# Patient Record
Sex: Female | Born: 1960 | Race: White | Hispanic: No | Marital: Married | Smoking: Never smoker
Health system: Southern US, Community
[De-identification: ages and names within clinical notes are randomized; demographics above are authoritative.]

## PROBLEM LIST (undated history)

## (undated) DIAGNOSIS — R634 Abnormal weight loss: Secondary | ICD-10-CM

## (undated) DIAGNOSIS — I10 Essential (primary) hypertension: Secondary | ICD-10-CM

## (undated) DIAGNOSIS — K219 Gastro-esophageal reflux disease without esophagitis: Secondary | ICD-10-CM

## (undated) HISTORY — DX: Essential (primary) hypertension: I10

## (undated) HISTORY — PX: ANKLE SURGERY: SHX546

## (undated) HISTORY — DX: Abnormal weight loss: R63.4

## (undated) HISTORY — DX: Gastro-esophageal reflux disease without esophagitis: K21.9

---

## 2011-08-10 ENCOUNTER — Other Ambulatory Visit: Payer: Self-pay | Admitting: Family Medicine

## 2011-08-10 DIAGNOSIS — Z1231 Encounter for screening mammogram for malignant neoplasm of breast: Secondary | ICD-10-CM

## 2011-08-28 ENCOUNTER — Ambulatory Visit
Admission: RE | Admit: 2011-08-28 | Discharge: 2011-08-28 | Disposition: A | Discharge status: Home / Self Care | Payer: 59 | Source: Ambulatory Visit | Attending: Family Medicine | Admitting: Family Medicine

## 2011-08-28 DIAGNOSIS — Z1231 Encounter for screening mammogram for malignant neoplasm of breast: Secondary | ICD-10-CM

## 2011-09-01 ENCOUNTER — Other Ambulatory Visit: Payer: Self-pay | Admitting: Family Medicine

## 2011-09-01 DIAGNOSIS — R928 Other abnormal and inconclusive findings on diagnostic imaging of breast: Secondary | ICD-10-CM

## 2011-09-18 ENCOUNTER — Ambulatory Visit
Admission: RE | Admit: 2011-09-18 | Discharge: 2011-09-18 | Disposition: A | Discharge status: Home / Self Care | Payer: 59 | Source: Ambulatory Visit | Attending: Family Medicine | Admitting: Family Medicine

## 2011-09-18 DIAGNOSIS — R928 Other abnormal and inconclusive findings on diagnostic imaging of breast: Secondary | ICD-10-CM

## 2012-08-31 ENCOUNTER — Other Ambulatory Visit: Payer: Self-pay | Admitting: Family Medicine

## 2012-08-31 DIAGNOSIS — N6489 Other specified disorders of breast: Secondary | ICD-10-CM

## 2012-09-09 ENCOUNTER — Ambulatory Visit
Admission: RE | Admit: 2012-09-09 | Discharge: 2012-09-09 | Disposition: A | Discharge status: Home / Self Care | Payer: 59 | Source: Ambulatory Visit | Attending: Family Medicine | Admitting: Family Medicine

## 2012-09-09 DIAGNOSIS — N6489 Other specified disorders of breast: Secondary | ICD-10-CM

## 2014-01-19 ENCOUNTER — Ambulatory Visit (INDEPENDENT_AMBULATORY_CARE_PROVIDER_SITE_OTHER): Payer: BC Managed Care – PPO | Admitting: Podiatrist

## 2014-01-19 ENCOUNTER — Ambulatory Visit (INDEPENDENT_AMBULATORY_CARE_PROVIDER_SITE_OTHER): Payer: BC Managed Care – PPO

## 2014-01-19 ENCOUNTER — Encounter: Payer: Self-pay | Admitting: Podiatrist

## 2014-01-19 VITALS — BP 143/79 | HR 86 | Resp 18

## 2014-01-19 DIAGNOSIS — R52 Pain, unspecified: Secondary | ICD-10-CM

## 2014-01-19 DIAGNOSIS — M7750 Other enthesopathy of unspecified foot: Secondary | ICD-10-CM

## 2014-01-19 DIAGNOSIS — M659 Synovitis and tenosynovitis, unspecified: Secondary | ICD-10-CM

## 2014-01-19 MED ORDER — DEXAMETHASONE SODIUM PHOSPHATE 120 MG/30ML IJ SOLN
4.0000 mg | Freq: Once | INTRAMUSCULAR | Status: AC
  Administered 2014-01-19: 4 mg via INTRAMUSCULAR

## 2014-01-19 MED ORDER — TRIAMCINOLONE ACETONIDE 10 MG/ML IJ SUSP
10.0000 mg | Freq: Once | INTRAMUSCULAR | Status: AC
  Administered 2014-01-19: 10 mg

## 2014-01-19 NOTE — Progress Notes (Signed)
   Subjective:    Patient ID: Imagene RichesSandra Kilfoyle, female    DOB: 1961-02-15, 53 y.o.   MRN: [id number]  HPI had [facility] on my right ankle 2011 [facility] and the doctor was Dr. Remer MachoEasley and I had fallen in 2009 and the surgery made it worse and burns and throbs and hurts when I walking and it also hurts in the arch of my right foot and has hurt since the surgery    Review of Systems  Constitutional: Positive for appetite change.  Skin: Positive for color change.  All other systems reviewed and are negative.       Objective:   Physical Exam GENERAL APPEARANCE: Alert, conversant. Appropriately groomed. No acute distress.  VASCULAR: Pedal pulses palpable at 2/4 DP and PT bilateral.  Capillary refill time is immediate to all digits,  Proximal to distal cooling it warm to warm.  Digital hair growth is present bilateral  NEUROLOGIC: sensation is intact epicritically and protectively to 5.07 monofilament at 5/5 sites bilateral.  Light touch is intact bilateral, vibratory sensation intact bilateral, achilles tendon reflex is intact bilateral.  MUSCULOSKELETAL: Pain at the insertion of the posterior tibial tendon into the navicular is present. Pain along the posterior tibial tendon at its course along the medial malleolus is also present. Discomfort along the deltoid ligament continues to be present status post deltoid ligament Reconstruction several years ago. Minimal pain at the insertion of the plantar fascia on the medial calcaneal tubercle is palpated.  DERMATOLOGIC: skin color, texture, and turger are within normal limits.  No preulcerative lesions are seen, no interdigital maceration noted.  No open lesions present.  Digital nails are asymptomatic.      Assessment & Plan:  Posterior tibial tendinitis right  Plan: Injected the area of maximal tenderness with dexamethasone and Marcaine mixture under sterile technique without complication. A plantar fascial strapping was applied. She  has good conforming  inserts and I recommended she continue wearing this daily. She's also been through multiple sessions of physical therapy, anti-inflammatory and steroid therapy, bracing, inserts all of which have failed to relieve her pain. Today is her first injection and we'll see how she does with this. It is not beneficial we'll consider epat therapy. She will call if she would like to consider epat

## 2014-01-19 NOTE — Patient Instructions (Signed)
Posterior Tibial Tendon Tendinitis  with Rehab  Tendonitis is a condition that is characterized by inflammation of a tendon or the lining (sheath) that surrounds it. The inflammation is usually caused by damage to the tendon, such as a tendon tear (strain). Sprains are classified into three categories. Grade 1 sprains cause pain, but the tendon is not lengthened. Grade 2 sprains include a lengthened ligament due to the ligament being stretched or partially ruptured. With grade 2 sprains there is still function, although the function may be diminished. Grade 3 sprains are characterized by a complete tear of the tendon or muscle, and function is usually impaired. Posterior tibialis tendonitis is tendonitis of the posterior tibial tendon, which attaches muscles of the lower leg to the foot. The posterior tibial tendon is located in the back of the ankle and helps the body straighten (plantarflex) and rotate inward (medially rotate) the ankle.  SYMPTOMS   · Pain, tenderness, swelling, warmth, and/or redness over the back of the inner ankle at the posterior tibial tendon or the inner part of the mid-foot.  · Pain that worsens with plantarflexion or medial rotation of the ankle.  · A crackling sound (crepitation) when the tendon is moved or touched.  CAUSES   Posterior tibial tendonitis occurs when damage to the posterior tibial tendon starts an inflammatory response. Common mechanisms of injury include:  · Degenerative (occurs with aging) processes that weaken the tendon and make it more susceptible to injury.  · Stress placed on the tendon from an increase in the intensity, frequency, or duration of training.  · Direct trauma to the ankle.  · Returning to activity before a previous ankle injury is allowed to heal.  RISK INCREASES WITH:  · Activities that involve repetitive and/or stressful plantarflexion (jumping, kicking, or running up/down hills).  · Poor strength and flexibility.  · Flat feet.  · Previous injury to  the foot, ankle, or leg.  PREVENTION   · Warm up and stretch properly before activity.  · Allow for adequate recovery between workouts.  · Maintain physical fitness:  · Strength, flexibility, and endurance.  · Cardiovascular fitness.  · Learn and use proper technique. When possible, have a coach correct improper technique.  · Complete rehabilitation from a previous foot, ankle, or leg injury.  · If you have flat feet, wear arch supports (orthotics).  PROGNOSIS   If treated properly, then the symptoms of tendonitis usually resolve within 6 weeks. This period may be shorter for injuries caused by direct trauma.  RELATED COMPLICATIONS   · Prolonged healing time, if improperly treated or re-injured.  · Recurrent symptoms that result in a chronic problem.  · Partial or complete tendon tear (rupture) requiring surgery.  TREATMENT   Treatment initially involves the use of ice and medication to help reduce pain and inflammation. The use of strengthening and stretching exercises may help reduce pain with activity. These exercises may be performed at home or with referral to a therapist. Often times, your caregiver will recommend immobilizing the ankle to allow the tendon to heal. If you have flat feet, the you may be advised to wear orthotic arch supports. If symptoms persist for greater than 6 months despite non-surgical (conservative) treatment, then surgery may be recommended.  MEDICATION   · If pain medication is necessary, then nonsteroidal anti-inflammatory medications, such as aspirin and ibuprofen, or other minor pain relievers, such as acetaminophen, are often recommended.  · Do not take pain medication for 7 days before surgery.  ·   Prescription pain relievers may be given if deemed necessary by your caregiver. Use only as directed and only as much as you need.  · Corticosteroid injections may be given by your caregiver. These injections should be reserved for the most serious cases, because they may only be given a  certain number of times.  HEAT AND COLD  · Cold treatment (icing) relieves pain and reduces inflammation. Cold treatment should be applied for 10 to 15 minutes every 2 to 3 hours for inflammation and pain and immediately after any activity that aggravates your symptoms. Use ice packs or massage the area with a piece of ice (ice massage).  · Heat treatment may be used prior to performing the stretching and strengthening activities prescribed by your caregiver, physical therapist, or athletic trainer. Use a heat pack or soak the injury in warm water.  SEEK MEDICAL CARE IF:  · Treatment seems to offer no benefit, or the condition worsens.  · Any medications produce adverse side effects.  EXERCISES  RANGE OF MOTION (ROM) AND STRETCHING EXERCISES - Posterior Tibial Tendon Tendinitis  These exercises may help you when beginning to rehabilitate your injury. Your symptoms may resolve with or without further involvement from your physician, physical therapist or athletic trainer. While completing these exercises, remember:   · Restoring tissue flexibility helps normal motion to return to the joints. This allows healthier, less painful movement and activity.  · An effective stretch should be held for at least 30 seconds.  · A stretch should never be painful. You should only feel a gentle lengthening or release in the stretched tissue.  RANGE OF MOTION - Ankle Plantar Flexion   · Sit with your right / left leg crossed over your opposite knee.  · Use your opposite hand to pull the top of your foot and toes toward you.  · You should feel a gentle stretch on the top of your foot/ankle. Hold this position for __________ seconds.  Repeat __________ times. Complete this exercise __________ times per day.   RANGE OF MOTION - Ankle Eversion   · Sit with your right / left ankle crossed over your opposite knee.  · Grip your foot with your opposite hand, placing your thumb on the top of your foot and your fingers across the bottom of  your foot.  · Gently push your foot downward with a slight rotation so your littlest toes rise slightly  · You should feel a gentle stretch on the inside of your ankle. Hold the stretch for __________ seconds.  Repeat __________ times. Complete this exercise __________ times per day.   RANGE OF MOTION - Ankle Inversion   · Sit with your right / left ankle crossed over your opposite knee.  · Grip your foot with your opposite hand, placing your thumb on the bottom of your foot and your fingers across the top of your foot.  · Gently pull your foot so the smallest toe comes toward you and your thumb pushes the inside of the ball of your foot away from you.  · You should feel a gentle stretch on the outside of your ankle. Hold the stretch for __________ seconds.  Repeat __________ times. Complete this exercise __________ times per day.   RANGE OF MOTION - Dorsi/Plantar Flexion  · While sitting with your right / left knee straight, draw the top of your foot upwards by flexing your ankle. Then reverse the motion, pointing your toes downward.  · Hold each position for __________   seconds.  · After completing your first set of exercises, repeat this exercise with your knee bent.  Repeat __________ times. Complete this exercise __________ times per day.   RANGE OF MOTION - Ankle Alphabet  · Imagine your right / left big toe is a pen.  · Keeping your hip and knee still, write out the entire alphabet with your "pen." Make the letters as large as you can without increasing any discomfort.  Repeat __________ times. Complete this exercise __________ times per day.   STRETCH - Gastrocsoleus   · Sit with your right / left leg extended. Holding onto both ends of a belt or towel, loop it around the ball of your foot.  · Keeping your right / left ankle and foot relaxed and your knee straight, pull your foot and ankle toward you using the belt/towel.  · You should feel a gentle stretch behind your calf or knee. Hold this position for  __________ seconds.  Repeat __________ times. Complete this exercise __________ times per day.   STRETCH  Gastroc, Standing   · Place hands on wall.  · Extend right / left leg, keeping the front knee somewhat bent.  · Slightly point your toes inward on your back foot.  · Keeping your right / left heel on the floor and your knee straight, shift your weight toward the wall, not allowing your back to arch.  · You should feel a gentle stretch in the right / left calf. Hold this position for __________ seconds.  Repeat __________ times. Complete this stretch __________ times per day.  STRETCH  Soleus, Standing   · Place hands on wall.  · Extend right / left leg, keeping the other knee somewhat bent.  · Slightly point your toes inward on your back foot.  · Keep your right / left heel on the floor, bend your back knee, and slightly shift your weight over the back leg so that you feel a gentle stretch deep in your back calf.  · Hold this position for __________ seconds.  Repeat __________ times. Complete this stretch __________ times per day.  STRENGTHENING EXERCISES - Posterior Tibial Tendon Tendinitis  These exercises may help you when beginning to rehabilitate your injury. They may resolve your symptoms with or without further involvement from your physician, physical therapist or athletic trainer. While completing these exercises, remember:   · Muscles can gain both the endurance and the strength needed for everyday activities through controlled exercises.  · Complete these exercises as instructed by your physician, physical therapist or athletic trainer. Progress the resistance and repetitions only as guided.  STRENGTH - Dorsiflexors  · Secure a rubber exercise band/tubing to a fixed object (ie. table, pole) and loop the other end around your right / left foot.  · Sit on the floor facing the fixed object. The band/tubing should be slightly tense when your foot is relaxed.  · Slowly draw your foot back toward you using  your ankle and toes.  · Hold this position for __________ seconds. Slowly release the tension in the band and return your foot to the starting position.  Repeat __________ times. Complete this exercise __________ times per day.   STRENGTH - Towel Curls  · Sit in a chair positioned on a non-carpeted surface.  · Place your foot on a towel, keeping your heel on the floor.  · Pull the towel toward your heel by only curling your toes. Keep your heel on the floor.  · If instructed   by your physician, physical therapist or athletic trainer, add ____________________ at the end of the towel.  Repeat __________ times. Complete this exercise __________ times per day.  STRENGTH - Ankle Eversion   · Secure one end of a rubber exercise band/tubing to a fixed object (table, pole). Loop the other end around your foot just before your toes.  · Place your fists between your knees. This will focus your strengthening at your ankle.  · Drawing the band/tubing across your opposite foot, slowly, pull your little toe out and up. Make sure the band/tubing is positioned to resist the entire motion.  · Hold this position for __________ seconds.  · Have your muscles resist the band/tubing as it slowly pulls your foot back to the starting position.  Repeat __________ times. Complete this exercise __________ times per day.   STRENGTH - Ankle Inversion   · Secure one end of a rubber exercise band/tubing to a fixed object (table, pole). Loop the other end around your foot just before your toes.  · Place your fists between your knees. This will focus your strengthening at your ankle.  · Slowly, pull your big toe up and in, making sure the band/tubing is positioned to resist the entire motion.  · Hold this position for __________ seconds.  · Have your muscles resist the band/tubing as it slowly pulls your foot back to the starting position.  Repeat __________ times. Complete this exercises __________ times per day.   Document Released: 10/19/2005  Document Revised: 01/11/2012 Document Reviewed: 01/31/2009  [employer]® Patient Information ©2014 [employer].

## 2014-02-28 ENCOUNTER — Ambulatory Visit: Payer: BC Managed Care – PPO | Admitting: Podiatrist

## 2014-03-07 ENCOUNTER — Ambulatory Visit: Payer: Self-pay | Admitting: Podiatrist

## 2014-03-07 ENCOUNTER — Encounter: Payer: Self-pay | Admitting: Podiatrist

## 2014-03-07 VITALS — BP 131/72 | HR 82 | Resp 18

## 2014-03-07 DIAGNOSIS — M7750 Other enthesopathy of unspecified foot: Secondary | ICD-10-CM

## 2014-03-07 DIAGNOSIS — M659 Synovitis and tenosynovitis, unspecified: Secondary | ICD-10-CM

## 2014-03-07 NOTE — Progress Notes (Signed)
It hurts every day and hurts on the inside of the right foot  Patient presents today for initial epat therapy of the right foot s/p deltoid ligament repair and [facility]-- she relates she still has pain and that the surgery, although successful left her with pain on her medial instep and arch.  We are going to perform epat therapy for her right foot.  Objective:  Neurovascular status intact-  Pain on palpation along the deltoid ligament and posterior tibial tendon insertion.  Minimal swelling is present right foot as well.    Assessment:  Tendonitis posterior tibial tendon and deltoid ligament s/p surgical repair right  Plan:  epat #1 carried out at today's visit at a max bar of 2.5, 3500 pulses at 11 hz. She tolerated this procedure well she will be seen again in 2 weeks for epat 2.

## 2014-03-14 ENCOUNTER — Ambulatory Visit: Payer: BC Managed Care – PPO | Admitting: Podiatrist

## 2014-03-16 ENCOUNTER — Ambulatory Visit: Payer: BC Managed Care – PPO | Admitting: Podiatrist

## 2014-03-21 ENCOUNTER — Ambulatory Visit: Payer: BC Managed Care – PPO | Admitting: Podiatrist

## 2014-03-21 ENCOUNTER — Ambulatory Visit (INDEPENDENT_AMBULATORY_CARE_PROVIDER_SITE_OTHER): Payer: BC Managed Care – PPO | Admitting: Podiatrist

## 2014-03-21 ENCOUNTER — Encounter: Payer: Self-pay | Admitting: Podiatrist

## 2014-03-21 VITALS — BP 140/82 | HR 90 | Resp 16

## 2014-03-21 DIAGNOSIS — M7750 Other enthesopathy of unspecified foot: Secondary | ICD-10-CM

## 2014-03-21 DIAGNOSIS — M659 Synovitis and tenosynovitis, unspecified: Secondary | ICD-10-CM

## 2014-03-21 NOTE — Progress Notes (Signed)
Patient presents today for 2nd epat therapy of the right foot s/p deltoid ligament repair and [facility] tib [facility]-- she relates she still has pain on the right foot despite a previous surgery-- relates pain is on her medial instep and arch.  We are continuing epat therapy for her right foot.  Objective:  Neurovascular status intact-  Pain on palpation along the deltoid ligament and posterior tibial tendon insertion.  Minimal swelling is present right foot as well.    Assessment:  Tendonitis posterior tibial tendon and deltoid ligament s/p surgical repair right  Plan:  epat #2 carried out at today's visit at a max bar of 3.6, 3500 pulses at 11 hz. She tolerated this procedure well she will be seen again in 3 weeks for epat 3.

## 2014-04-11 ENCOUNTER — Ambulatory Visit (INDEPENDENT_AMBULATORY_CARE_PROVIDER_SITE_OTHER): Payer: BC Managed Care – PPO | Admitting: Podiatrist

## 2014-04-11 ENCOUNTER — Ambulatory Visit: Payer: BC Managed Care – PPO | Admitting: Podiatrist

## 2014-04-11 ENCOUNTER — Encounter: Payer: Self-pay | Admitting: Podiatrist

## 2014-04-11 VITALS — BP 122/86 | HR 86 | Resp 12

## 2014-04-11 DIAGNOSIS — M7750 Other enthesopathy of unspecified foot: Secondary | ICD-10-CM

## 2014-04-11 DIAGNOSIS — M659 Synovitis and tenosynovitis, unspecified: Secondary | ICD-10-CM

## 2014-04-11 NOTE — Progress Notes (Signed)
Patient presents today for epat therapy of the right foot s/p deltoid ligament repair and post tib tendon repair at [facility]-- she relates she still has pain and has noticed minimal to no improvement with EPAT therapy. Today is her 3rd treatment.   Objective: Neurovascular status intact- Pain on palpation along the deltoid ligament and posterior tibial tendon insertion. Minimal swelling is present right foot as well.   Assessment: Tendonitis posterior tibial tendon and deltoid ligament s/p surgical repair right   Plan: epat #3 carried out at today's visit at a max bar of 4.0, 3500 pulses at 12 hz. She tolerated this procedure well she will be seen again in 4 weeks for reevaluation and possible final epat 4

## 2014-05-16 ENCOUNTER — Ambulatory Visit (INDEPENDENT_AMBULATORY_CARE_PROVIDER_SITE_OTHER): Payer: BC Managed Care – PPO | Admitting: Podiatrist

## 2014-05-16 ENCOUNTER — Encounter: Payer: Self-pay | Admitting: Podiatrist

## 2014-05-16 VITALS — BP 135/87 | HR 99 | Resp 18

## 2014-05-16 DIAGNOSIS — M659 Synovitis and tenosynovitis, unspecified: Secondary | ICD-10-CM

## 2014-05-16 DIAGNOSIS — M7750 Other enthesopathy of unspecified foot: Secondary | ICD-10-CM

## 2014-05-16 NOTE — Progress Notes (Signed)
I AM DOING OK ON MY RIGHT HEEL  Patient presents today for epat therapy of the right foot s/p deltoid ligament repair and [facility]-- she relates she still has pain and has noticed minimal improvement with EPAT therapy. Today is her 4th treatment.   Objective: Neurovascular status intact- Pain on palpation along the deltoid ligament and posterior tibial tendon insertion. Minimal swelling is present right foot as well.   Assessment: Tendonitis posterior tibial tendon and deltoid ligament s/p surgical repair right   Plan: epat #4 carried out at today's visit at a max bar of 4.2, 3500 pulses at 11 hz. She tolerated this procedure well she will wait 4 weeks and will call if she feels another treatment would be beneficial.  We have discussed the only other option would possibly be surgery but she is not interested in pursuing any other surgeries on this foot.  I also did suggest breakthrough physical therapy for some possible dry needling therapy.  She will consider this option and call if she would like a referral.

## 2014-05-16 NOTE — Patient Instructions (Signed)

## 2016-03-18 ENCOUNTER — Other Ambulatory Visit: Payer: Self-pay | Admitting: Nurse Practitioner

## 2016-03-18 DIAGNOSIS — N6489 Other specified disorders of breast: Secondary | ICD-10-CM

## 2017-03-24 ENCOUNTER — Other Ambulatory Visit: Payer: Self-pay | Admitting: Nurse Practitioner

## 2017-03-24 DIAGNOSIS — N6489 Other specified disorders of breast: Secondary | ICD-10-CM

## 2017-03-31 ENCOUNTER — Other Ambulatory Visit: Payer: Self-pay

## 2017-04-02 ENCOUNTER — Other Ambulatory Visit: Payer: Self-pay

## 2017-04-27 ENCOUNTER — Other Ambulatory Visit: Payer: Self-pay

## 2017-04-28 ENCOUNTER — Ambulatory Visit
Admission: RE | Admit: 2017-04-28 | Discharge: 2017-04-28 | Disposition: A | Discharge status: Home / Self Care | Payer: BLUE CROSS/BLUE SHIELD | Source: Ambulatory Visit | Attending: Nurse Practitioner | Admitting: Nurse Practitioner

## 2017-04-28 DIAGNOSIS — N6489 Other specified disorders of breast: Secondary | ICD-10-CM

## 2019-09-19 ENCOUNTER — Other Ambulatory Visit: Payer: Self-pay

## 2019-09-19 ENCOUNTER — Encounter: Payer: Self-pay | Admitting: Sports Medicine

## 2019-09-19 ENCOUNTER — Ambulatory Visit (INDEPENDENT_AMBULATORY_CARE_PROVIDER_SITE_OTHER): Payer: BC Managed Care – PPO | Admitting: Sports Medicine

## 2019-09-19 DIAGNOSIS — M79675 Pain in left toe(s): Secondary | ICD-10-CM | POA: Diagnosis not present

## 2019-09-19 DIAGNOSIS — L600 Ingrowing nail: Secondary | ICD-10-CM

## 2019-09-19 MED ORDER — NEOMYCIN-POLYMYXIN-HC 3.5-10000-1 OP SUSP: OPHTHALMIC | 0 refills | Status: DC

## 2019-09-19 NOTE — Patient Instructions (Signed)

## 2019-09-19 NOTE — Progress Notes (Signed)
Subjective: Molly Walsh is a 58 y.o. female patient presents to office today complaining of a moderately painful incurvated, red, hot, swollen lateral nail border of the 1st toe on the Left foot. This has been present for 8 months. Patient has treated this by epsom soaks, antibiotic onitiment, and dental floss. Patient denies fever/chills/nausea/vomitting/any other related constitutional symptoms at this time.  Review of Systems  All other systems reviewed and are negative.   There are no active problems to display for this patient.   Current Outpatient Medications on File Prior to Visit  Medication Sig Dispense Refill  . acetaminophen-codeine (TYLENOL #3) 300-30 MG per tablet 1-2 pills tid prn pain, alternate with hydrocodone    . amLODipine (NORVASC) 5 MG tablet Take 5 mg by mouth daily.    Marland Kitchen amLODipine (NORVASC) 5 MG tablet TAKE 1 TABLET BY MOUTH DAILY    . cholecalciferol (VITAMIN D) 1000 UNITS tablet Take 5,000 mg by mouth.    Marland Kitchen HYDROcodone-acetaminophen (VICODIN) 5-500 MG per tablet 1 pill qhs prn    . ibuprofen (ADVIL,MOTRIN) 200 MG tablet Take 200 mg by mouth every 6 (six) hours as needed.    Marland Kitchen ibuprofen (ADVIL,MOTRIN) 200 MG tablet Take 200 mg by mouth.    . promethazine (PHENERGAN) 25 MG tablet 1 po q 6hrss prn nausea    . traMADol (ULTRAM) 50 MG tablet Take by mouth every 6 (six) hours as needed.    . traMADol (ULTRAM) 50 MG tablet TAKE 1-2 TABLETS BY MOUTH THREE TIMES DAILY AS NEEDED FOR PAIN    . traZODone (DESYREL) 50 MG tablet     . Vitamin D, Ergocalciferol, (DRISDOL) 50000 UNITS CAPS capsule Take 2,000 Units by mouth every 7 (seven) days.     No current facility-administered medications on file prior to visit.     No Known Allergies  Objective:  There were no vitals filed for this visit.  General: Well developed, nourished, in no acute distress, alert and oriented x3   Dermatology: Skin is warm, dry and supple bilateral. Left hallux nail appears to be severely  incurvated with hyperkeratosis formation at the distal aspects of the lateral nail border. (+) Erythema. (+) Edema. (-) serosanguous drainage present. The remaining nails appear unremarkable at this time. There are no open sores, lesions or other signs of infection present.  Vascular: Dorsalis Pedis artery and Posterior Tibial artery pedal pulses are 2/4 bilateral with immedate capillary fill time. Pedal hair growth present. No lower extremity edema.   Neruologic: Grossly intact via light touch bilateral.  Musculoskeletal: Tenderness to palpation of the Left hallux lateral nail fold. Muscular strength within normal limits in all groups bilateral.   Assesement and Plan: Problem List Items Addressed This Visit    None    Visit Diagnoses    Ingrown toenail of left foot    -  Primary   Toe pain, left          -Discussed treatment alternatives and plan of care; Explained permanent/temporary nail avulsion and post procedure course to patient. Patient elects for PNA, Left 1st toe lateral margin - After a verbal and written consent, injected 3 ml of a 50:50 mixture of 2% plain lidocaine and 0.5% plain marcaine in a normal hallux block fashion. Next, a betadine prep was performed. Anesthesia was tested and found to be appropriate.  The offending Left hallux lateral nail border was then incised from the hyponychium to the epinychium. The offending nail border was removed and cleared from the field.  The area was curretted for any remaining nail or spicules. Phenol application performed and the area was then flushed with alcohol and dressed with antibiotic cream and a dry sterile dressing. -Patient was instructed to leave the dressing intact for today and begin soaking in a weak solution of Epsom salt and water tomorrow. Patient was instructed to soak for 15-20 minutes each day and apply neosporin/corticosporin and a gauze or bandaid dressing each day. -Patient was instructed to monitor the toe for signs  of infection and return to office if toe becomes red, hot or swollen. -Advised ice, elevation, and tylenol or motrin if needed for pain.  -Patient is to return in 2 weeks for follow up care/nail check or sooner if problems arise. -Procedure was performed with assistance of student, Geraldo Docker.   Landis Martins, DPM

## 2019-09-20 ENCOUNTER — Telehealth: Payer: Self-pay | Admitting: *Deleted

## 2019-09-20 MED ORDER — NEOMYCIN-POLYMYXIN-HC 1 % OT SOLN: OTIC | 1 refills | Status: AC

## 2019-09-20 NOTE — Telephone Encounter (Signed)
[facility] - [address] states Cortisporin ophthalmic suspension is not available in [place], can they use otic solution. I okayed order for the otic solution

## 2019-10-03 ENCOUNTER — Ambulatory Visit: Payer: BC Managed Care – PPO | Admitting: Sports Medicine

## 2019-10-17 ENCOUNTER — Ambulatory Visit: Payer: BC Managed Care – PPO | Admitting: Sports Medicine

## 2020-03-02 ENCOUNTER — Ambulatory Visit: Payer: Self-pay | Attending: Internal Medicine

## 2020-03-02 DIAGNOSIS — Z23 Encounter for immunization: Secondary | ICD-10-CM

## 2020-03-02 NOTE — Progress Notes (Signed)
   Covid-19 Vaccination Clinic  Name:  Keirstan Iannello    MRN: [id number] DOB: 1961-06-15  03/02/2020  Ms. Carrender was observed post Covid-19 immunization for 15 minutes without incident. She was provided with Vaccine Information Sheet and instruction to access the V-Safe system.   Ms. Hendrix was instructed to call 911 with any severe reactions post vaccine: Marland Kitchen Difficulty breathing  . Swelling of face and throat  . A fast heartbeat  . A bad rash all over body  . Dizziness and weakness   Immunizations Administered    Name Date Dose VIS Date Route   Pfizer COVID-19 Vaccine 03/02/2020  8:27 AM 0.3 mL 12/27/2018 Intramuscular   Manufacturer: [employer], [employer]   Lot: [id number]   NDC: [address]-9

## 2020-03-21 ENCOUNTER — Ambulatory Visit (INDEPENDENT_AMBULATORY_CARE_PROVIDER_SITE_OTHER): Payer: BC Managed Care – PPO | Admitting: Podiatry

## 2020-03-21 ENCOUNTER — Other Ambulatory Visit: Payer: Self-pay

## 2020-03-21 DIAGNOSIS — L600 Ingrowing nail: Secondary | ICD-10-CM | POA: Diagnosis not present

## 2020-03-21 DIAGNOSIS — M79676 Pain in unspecified toe(s): Secondary | ICD-10-CM

## 2020-03-21 NOTE — Patient Instructions (Signed)

## 2020-03-25 ENCOUNTER — Ambulatory Visit: Payer: Self-pay | Attending: Internal Medicine

## 2020-03-25 DIAGNOSIS — Z23 Encounter for immunization: Secondary | ICD-10-CM

## 2020-03-25 NOTE — Progress Notes (Signed)
   Covid-19 Vaccination Clinic  Name:  Azalea Cedar    MRN: [id number] DOB: [birth year]  03/25/2020  Ms. Mullan was observed post Covid-19 immunization for 15 minutes without incident. She was provided with Vaccine Information Sheet and instruction to access the V-Safe system.   Ms. Macha was instructed to call 911 with any severe reactions post vaccine: Marland Kitchen Difficulty breathing  . Swelling of face and throat  . A fast heartbeat  . A bad rash all over body  . Dizziness and weakness   Immunizations Administered    Name Date Dose VIS Date Route   Pfizer COVID-19 Vaccine 03/25/2020  4:23 PM 0.3 mL 12/27/2018 Intramuscular   Manufacturer: [employer], [employer]   Lot: [id number]   NDC: [address]-2

## 2020-04-04 ENCOUNTER — Ambulatory Visit (INDEPENDENT_AMBULATORY_CARE_PROVIDER_SITE_OTHER): Payer: BC Managed Care – PPO | Admitting: Podiatry

## 2020-04-04 ENCOUNTER — Other Ambulatory Visit: Payer: Self-pay

## 2020-04-04 VITALS — Temp 97.2°F

## 2020-04-04 DIAGNOSIS — L600 Ingrowing nail: Secondary | ICD-10-CM

## 2020-04-04 DIAGNOSIS — M79676 Pain in unspecified toe(s): Secondary | ICD-10-CM

## 2020-04-04 NOTE — Progress Notes (Signed)
  Subjective:  Patient ID: Molly Walsh, female    DOB: [birth year],  MRN: [id number]  Chief Complaint  Patient presents with  . Nail Problem    pt is here for an ingrown toenail check of the left great toenail lateral side, pt states that she is concerned that her big toenail is possibly infected, pt also states that the left big toenail has recently been oozing and pussing. Pt also states that she has tried soaking them salts, as well as applying the drops she recieved last time    59 y.o. female presents with the above complaint. History confirmed with patient.   Objective:  Physical Exam: warm, good capillary refill, no trophic changes or ulcerative lesions, normal DP and PT pulses and normal sensory exam.  Painful ingrowing nail at lateral border of the left, hallux; local warmth noted and local erythema noted  Assessment:   1. Ingrown nail   2. Pain around toenail      Plan:  Patient was evaluated and treated and all questions answered.  Ingrown Nail, left -Patient elects to proceed with ingrown toenail removal today -Ingrown nail excised. See procedure note. -Educated on post-procedure care including soaking. Written instructions provided. -Rx for Cortisporin drops -Patient to follow up in 2 weeks for nail check.  Procedure: Excision of Ingrown Toenail Location: Left 1st toe lateral nail borders. Anesthesia: Lidocaine 1% plain; 1.22mL and Marcaine 0.5% plain; 1.42mL, digital block. Skin Prep: Betadine. Dressing: Silvadene; telfa; dry, sterile, compression dressing. Technique: Following skin prep, the toe was exsanguinated and a tourniquet was secured at the base of the toe. The affected nail border was freed, split with a nail splitter, and excised. Chemical matrixectomy was then performed with phenol and irrigated out with alcohol. The tourniquet was then removed and sterile dressing applied. Disposition: Patient tolerated procedure well. Patient to return in 2 weeks for  follow-up. No follow-ups on file.   MDM

## 2020-04-04 NOTE — Progress Notes (Signed)
°  Subjective:  Patient ID: Molly Walsh, female    DOB: 18-Mar-1961,  MRN: [id number]  Chief Complaint  Patient presents with   Follow-up    Nail check - L hallux, lateral border. Pt stated, "It has healed - no tenderness or drainage. I've stopped soaking in Epsom salt".   Nail Problem    L hallux, medial border. x2 weeks. Pt stated, "I have some tenderness on this side, but no drainage. I want to talk about prevention, not a procedure; I'm going to the beach on Saturday".    59 y.o. female presents for follow up of nail procedure. History confirmed with patient.   Objective:  Physical Exam: Ingrown nail avulsion site: overlying soft crust, no warmth, no drainage and no erythema Assessment:   1. Ingrown nail   2. Pain around toenail    Plan:  Patient was evaluated and treated and all questions answered.  S/p Ingrown Toenail Excision, left -Healing well without issue. -Discussed return precautions. -F/u PRN

## 2021-06-19 ENCOUNTER — Other Ambulatory Visit: Payer: Self-pay | Admitting: Nurse Practitioner

## 2021-06-19 DIAGNOSIS — N6459 Other signs and symptoms in breast: Secondary | ICD-10-CM

## 2021-07-03 ENCOUNTER — Ambulatory Visit
Admission: RE | Admit: 2021-07-03 | Discharge: 2021-07-03 | Disposition: A | Discharge status: Home / Self Care | Payer: BC Managed Care – PPO | Source: Ambulatory Visit | Attending: Nurse Practitioner | Admitting: Nurse Practitioner

## 2021-07-03 ENCOUNTER — Ambulatory Visit
Admission: RE | Admit: 2021-07-03 | Discharge: 2021-07-03 | Disposition: A | Discharge status: Home / Self Care | Payer: Self-pay | Source: Ambulatory Visit | Attending: Nurse Practitioner | Admitting: Nurse Practitioner

## 2021-07-03 ENCOUNTER — Other Ambulatory Visit: Payer: Self-pay

## 2021-07-03 DIAGNOSIS — N6459 Other signs and symptoms in breast: Secondary | ICD-10-CM

## 2022-03-17 IMAGING — US US BREAST*R* LIMITED INC AXILLA
1 series · 6 of 6 positions shown · non-contrast
Comparison: Previous exam(s).

CLINICAL DATA: Tenderness to palpation in the axillary tail region
of the right breast for the past 2 months.

EXAM:
DIGITAL DIAGNOSTIC BILATERAL MAMMOGRAM WITH TOMOSYNTHESIS AND CAD;
ULTRASOUND RIGHT BREAST LIMITED
TECHNIQUE: Bilateral digital diagnostic mammography and breast tomosynthesis
was performed. The images were evaluated with computer-aided
detection.; Targeted ultrasound examination of the right breast was
performed

[Series 1: us breast*right* limited inc axilla · 0.07mm/px · 6 of 6 slices shown]
[im 1/6]
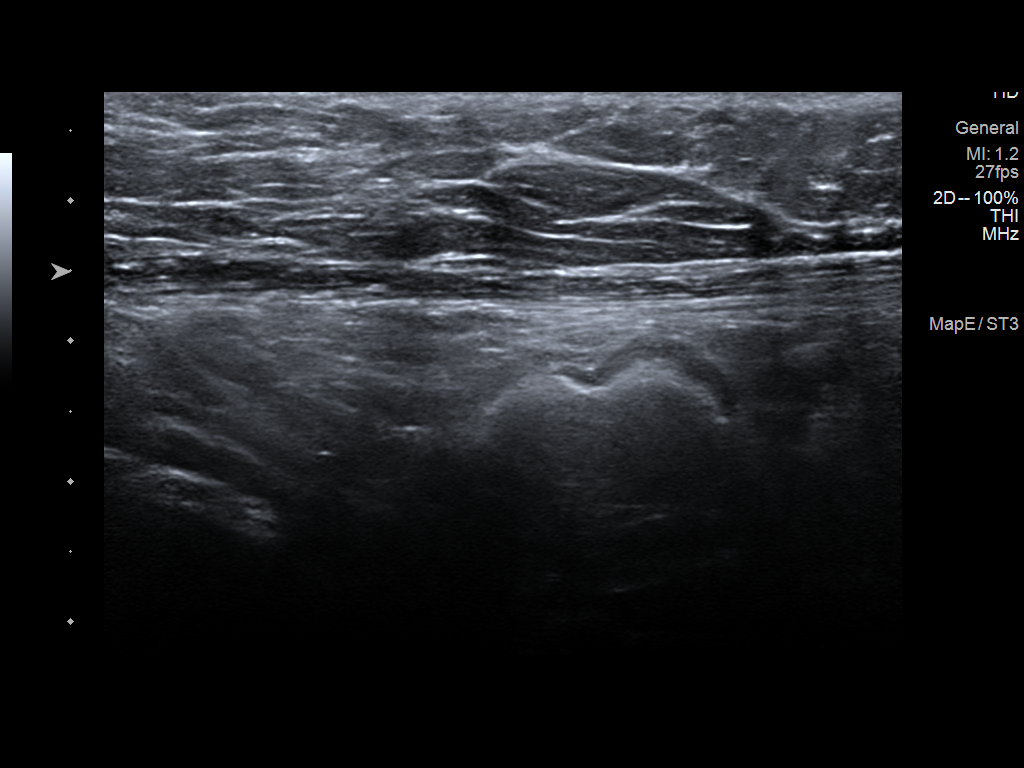
[im 2/6]
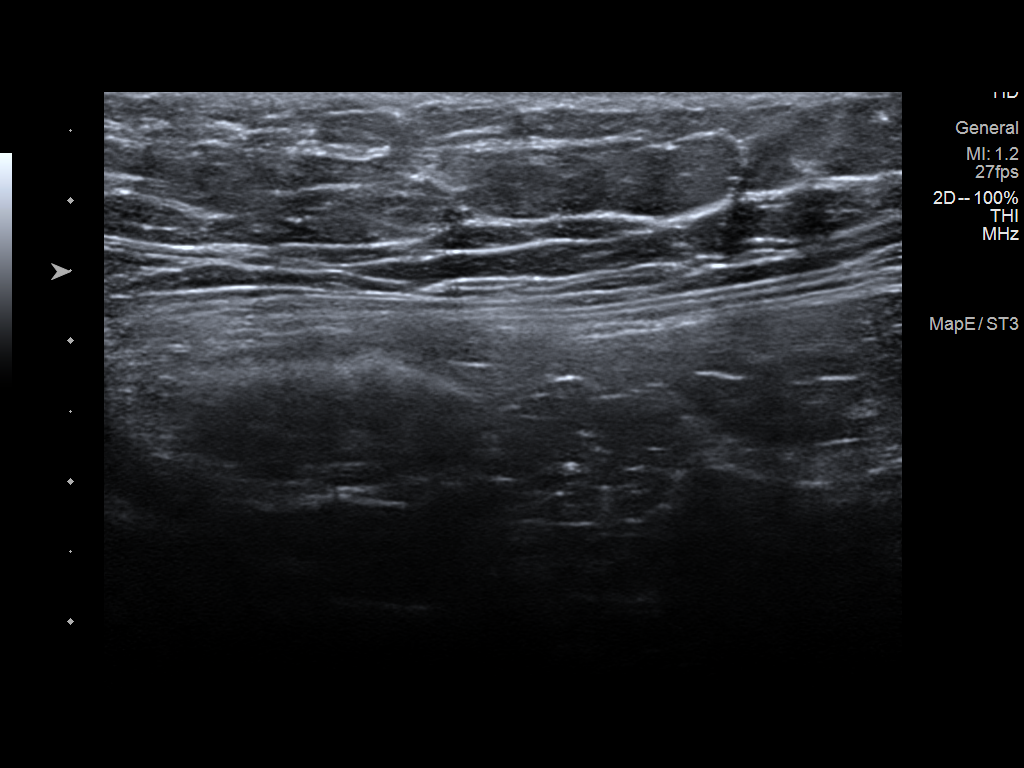
[im 3/6]
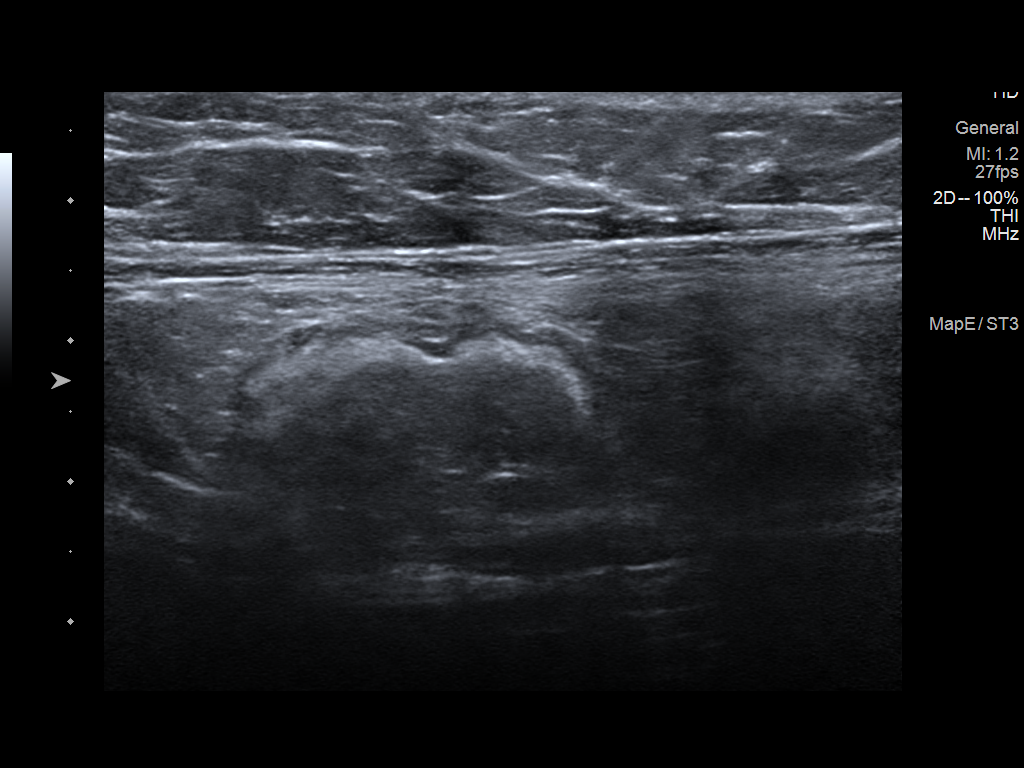
[im 4/6]
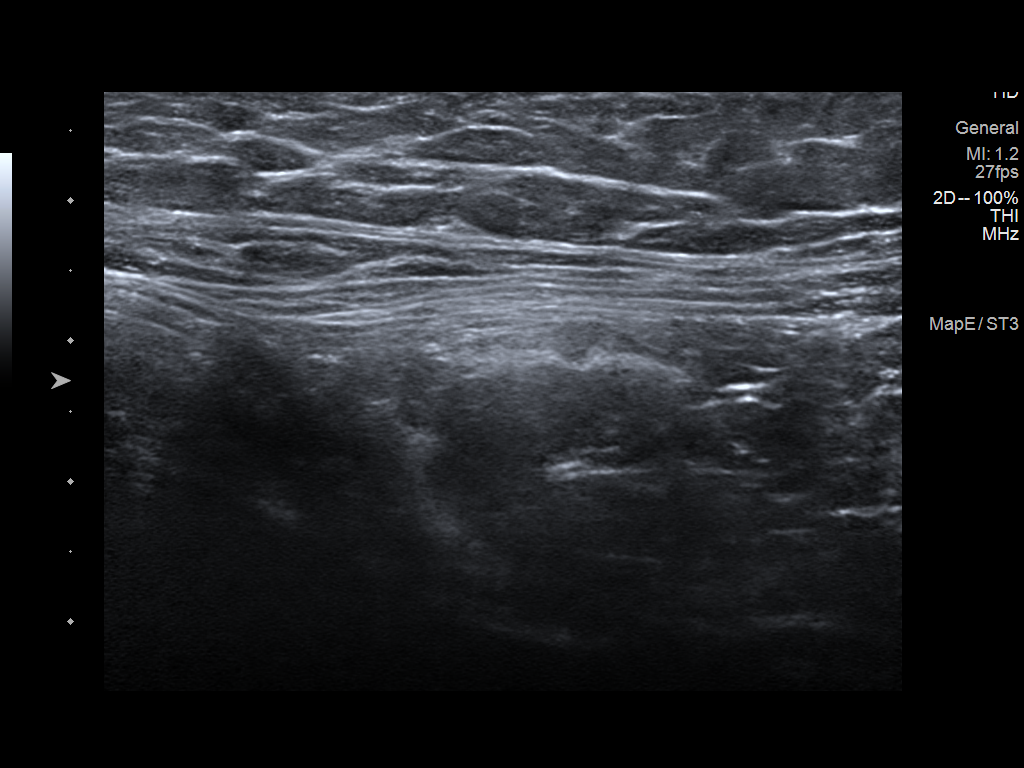
[im 5/6]
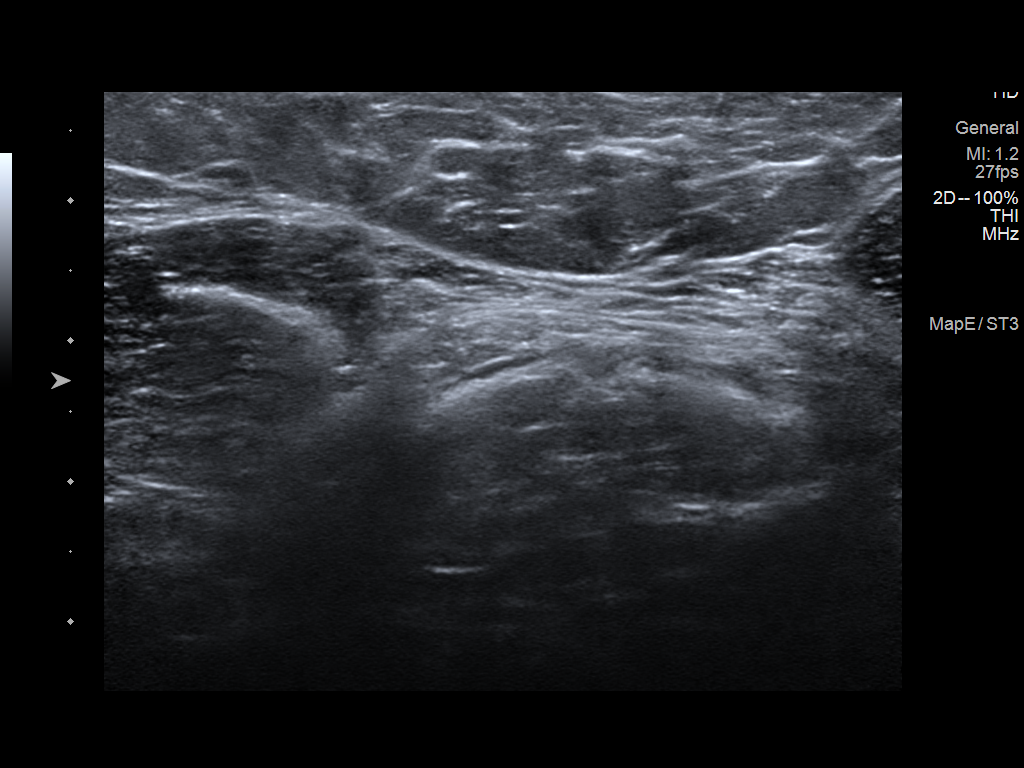
[im 6/6]
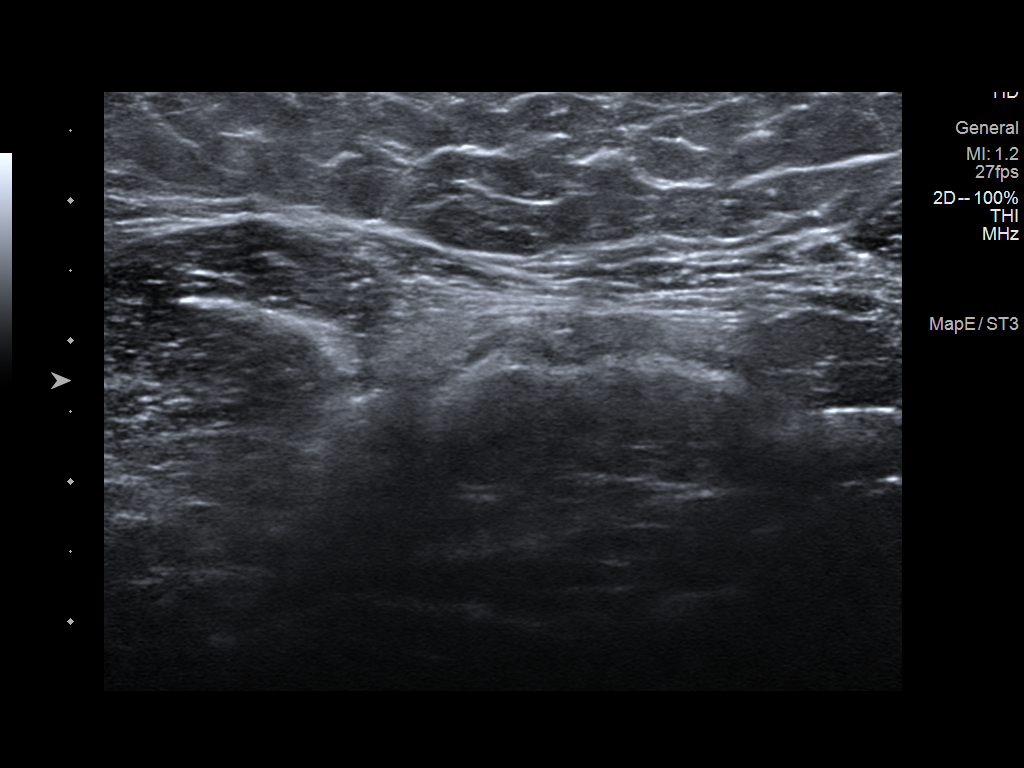

[6 of 6 positions shown; findings below may reference images not displayed]

ACR Breast Density Category c: The breast tissue is heterogeneously
dense, which may obscure small masses.
FINDINGS: Stable mammographic appearance of the breasts with no interval
findings suspicious for malignancy in either breast. No abnormality
is demonstrated in the axillary tail region on the right at the
location of tenderness, marked with a metallic marker.

On physical exam, the patient is tender to palpation over an
approximately 4 x 3 cm area in the axillary tail region of the right
breast and inferior right axilla. There are no palpable masses.

Targeted ultrasound is performed, showing normal appearing axillary
contents including normal appearing lymph nodes in the area of
palpable concern. No axillary tail abnormalities were seen.
IMPRESSION: No evidence of malignancy.

RECOMMENDATION:
Bilateral screening mammogram 1 year.

I have discussed the findings and recommendations with the patient.
If applicable, a reminder letter will be sent to the patient
regarding the next appointment.

BI-RADS CATEGORY  1: Negative.

## 2022-03-17 IMAGING — MG DIGITAL DIAGNOSTIC BILAT W/ TOMO W/ CAD
6 of 12 series · 6 of 36 positions shown · non-contrast
Comparison: Previous exam(s).

CLINICAL DATA: Tenderness to palpation in the axillary tail region
of the right breast for the past 2 months.

EXAM:
DIGITAL DIAGNOSTIC BILATERAL MAMMOGRAM WITH TOMOSYNTHESIS AND CAD;
ULTRASOUND RIGHT BREAST LIMITED
TECHNIQUE: Bilateral digital diagnostic mammography and breast tomosynthesis
was performed. The images were evaluated with computer-aided
detection.; Targeted ultrasound examination of the right breast was
performed

[R MLO synth-2D (1 of 2)]
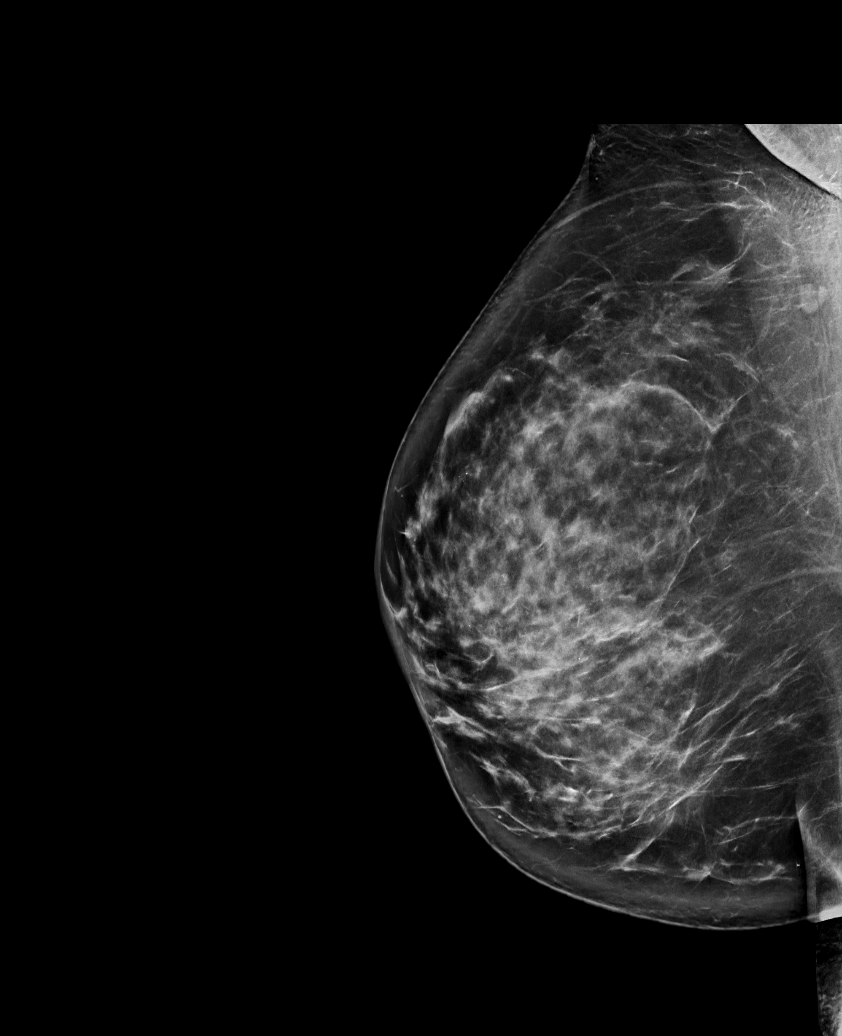

[R CC synth-2D (1 of 2)]
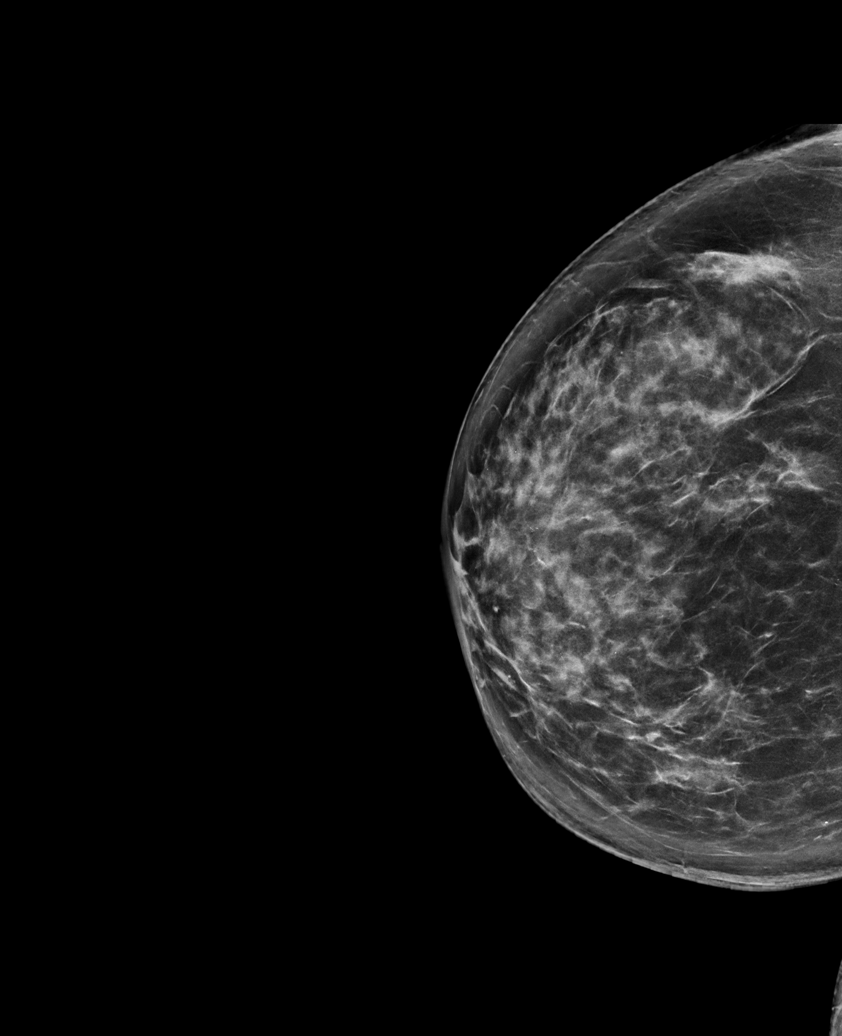

[R MLO synth-2D (2 of 2)]
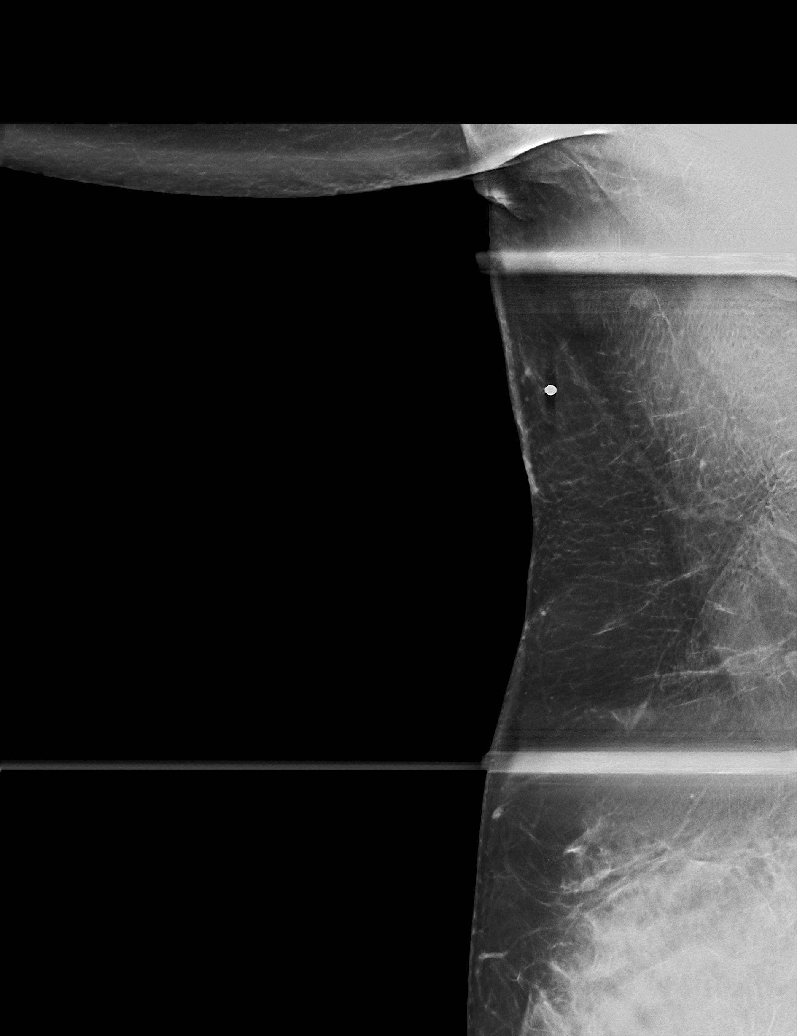

[R CC synth-2D (2 of 2)]
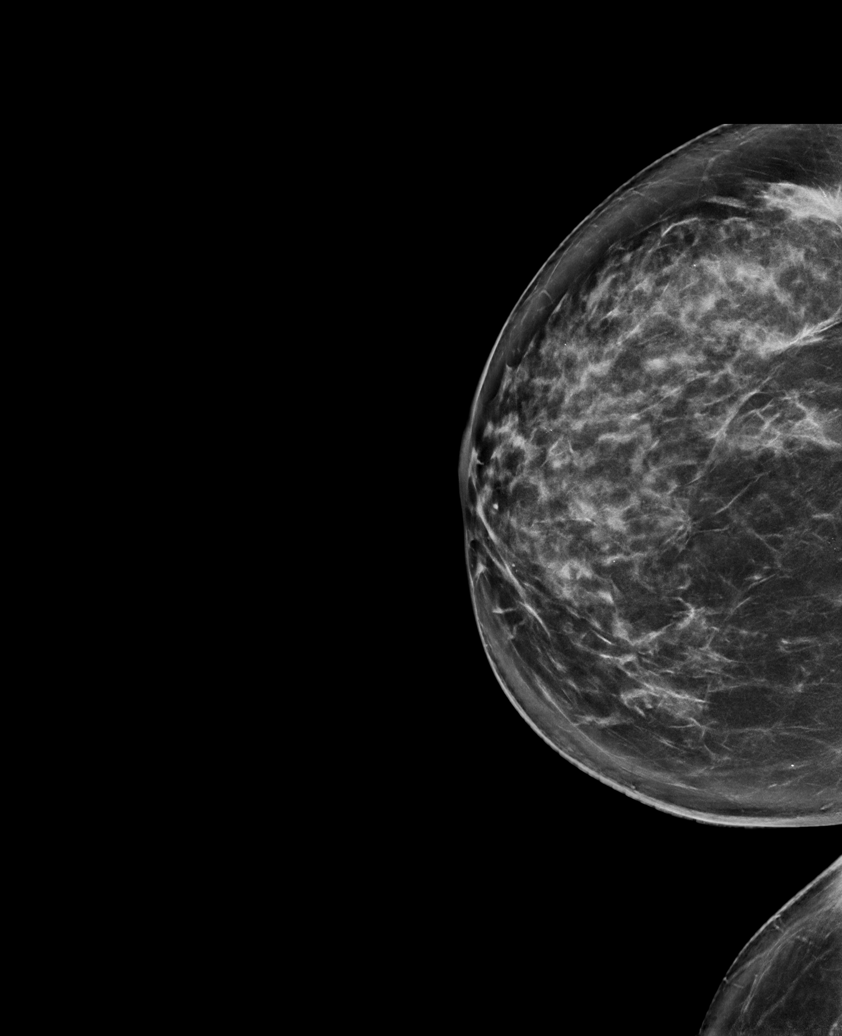

[L CC synth-2D]
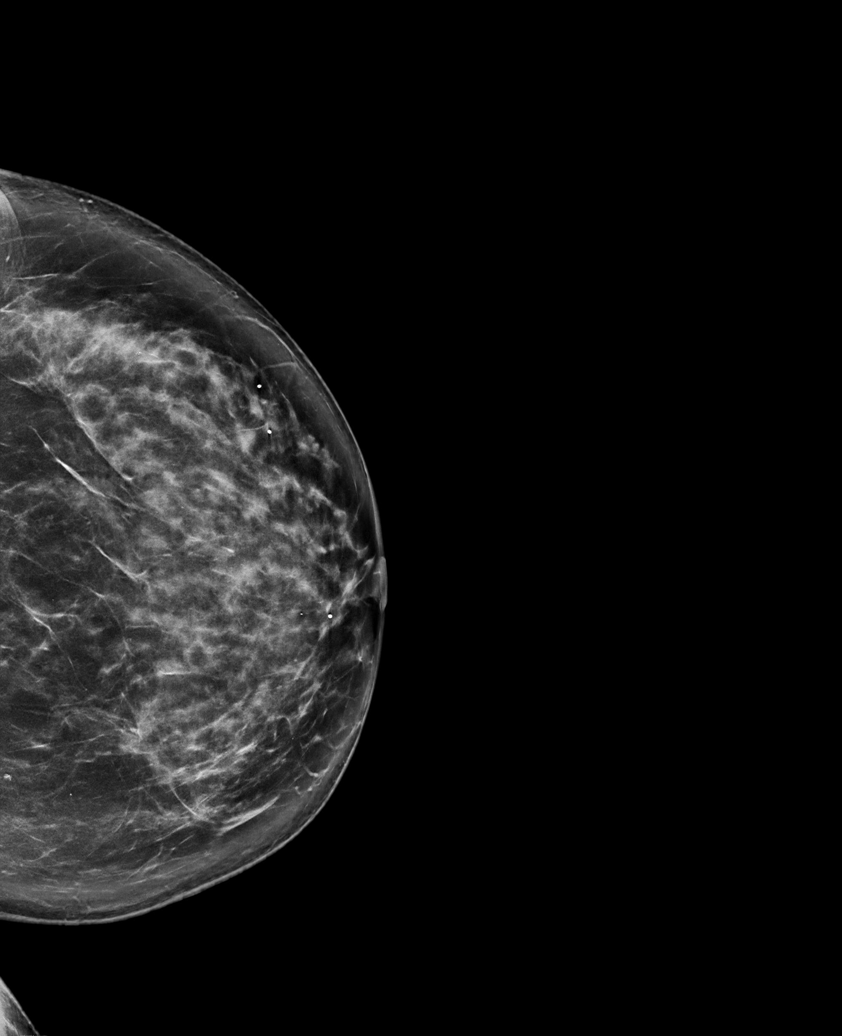

[L MLO synth-2D]
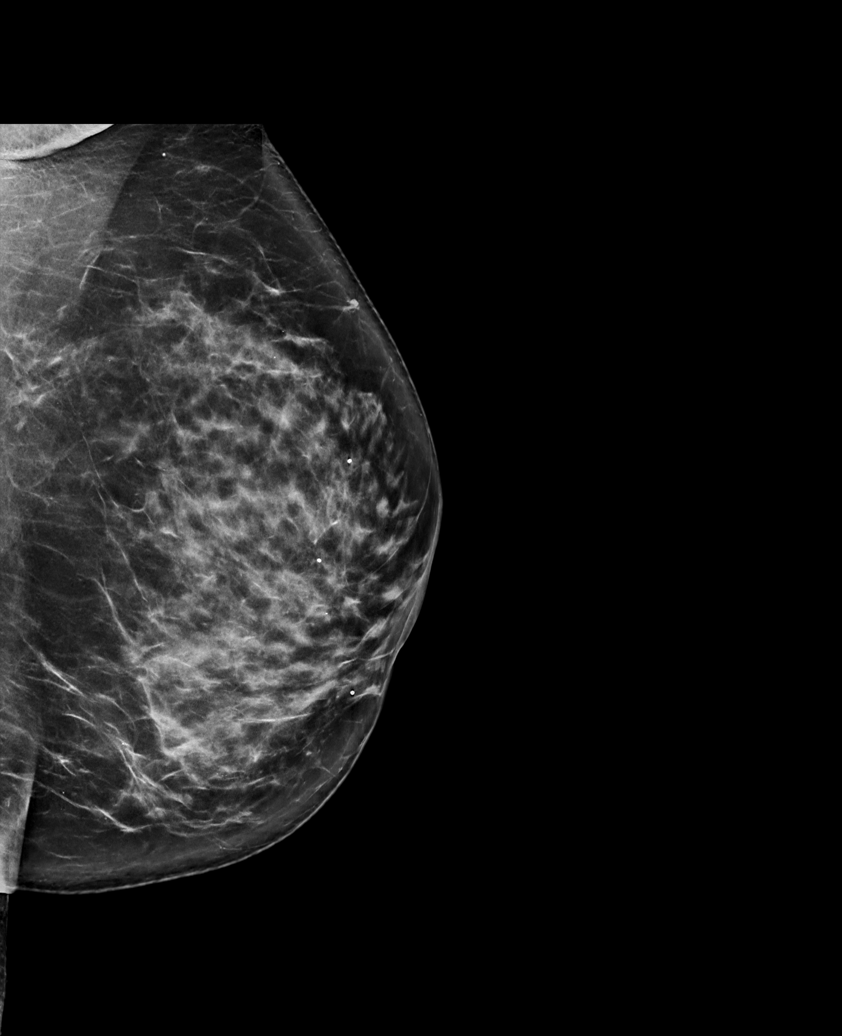

[6 of 36 positions shown; findings below may reference images not displayed]

ACR Breast Density Category c: The breast tissue is heterogeneously
dense, which may obscure small masses.
FINDINGS: Stable mammographic appearance of the breasts with no interval
findings suspicious for malignancy in either breast. No abnormality
is demonstrated in the axillary tail region on the right at the
location of tenderness, marked with a metallic marker.

On physical exam, the patient is tender to palpation over an
approximately 4 x 3 cm area in the axillary tail region of the right
breast and inferior right axilla. There are no palpable masses.

Targeted ultrasound is performed, showing normal appearing axillary
contents including normal appearing lymph nodes in the area of
palpable concern. No axillary tail abnormalities were seen.
IMPRESSION: No evidence of malignancy.

RECOMMENDATION:
Bilateral screening mammogram 1 year.

I have discussed the findings and recommendations with the patient.
If applicable, a reminder letter will be sent to the patient
regarding the next appointment.

BI-RADS CATEGORY  1: Negative.
